# Patient Record
Sex: Male | Born: 1985 | Race: White | Hispanic: No | Marital: Married | Smoking: Current every day smoker
Health system: Southern US, Community
[De-identification: ages and names within clinical notes are randomized; demographics above are authoritative.]

---

## 2010-05-09 ENCOUNTER — Emergency Department (HOSPITAL_COMMUNITY)
Admission: EM | Admit: 2010-05-09 | Discharge: 2010-05-10 | Payer: Self-pay | Source: Home / Self Care | Admitting: Emergency Medicine

## 2010-05-10 ENCOUNTER — Emergency Department (HOSPITAL_COMMUNITY)
Admission: EM | Admit: 2010-05-10 | Discharge: 2010-05-11 | Payer: Self-pay | Source: Home / Self Care | Admitting: Emergency Medicine

## 2010-08-20 LAB — URINE MICROSCOPIC-ADD ON

## 2010-08-20 LAB — URINALYSIS, ROUTINE W REFLEX MICROSCOPIC
Glucose, UA: NEGATIVE mg/dL
Ketones, ur: NEGATIVE mg/dL
Leukocytes, UA: NEGATIVE
Nitrite: NEGATIVE
Specific Gravity, Urine: 1.03 (ref 1.005–1.030)
pH: 5.5 (ref 5.0–8.0)

## 2010-08-20 LAB — BASIC METABOLIC PANEL
BUN: 9 mg/dL (ref 6–23)
CO2: 26 mEq/L (ref 19–32)
Calcium: 9.1 mg/dL (ref 8.4–10.5)
GFR calc non Af Amer: >60 mL/min (ref >60)
Glucose, Bld: 104 mg/dL — ABNORMAL HIGH (ref 70–99)
Sodium: 141 mEq/L (ref 135–145)

## 2010-08-20 LAB — DIFFERENTIAL
Basophils Absolute: 0 10*3/uL (ref 0.0–0.1)
Basophils Relative: 0 % (ref 0–1)
Eosinophils Absolute: 0.6 10*3/uL (ref 0.0–0.7)
Monocytes Absolute: 0.9 10*3/uL (ref 0.1–1.0)
Monocytes Relative: 8 % (ref 3–12)
Neutrophils Relative %: 42 % — ABNORMAL LOW (ref 43–77)

## 2010-08-20 LAB — CBC
Hemoglobin: 14.6 g/dL (ref 13.0–17.0)
MCH: 33.2 pg (ref 26.0–34.0)
MCHC: 36 g/dL (ref 30.0–36.0)
RDW: 12.1 % (ref 11.5–15.5)

## 2010-12-30 ENCOUNTER — Ambulatory Visit: Payer: Self-pay | Admitting: Medical

## 2011-01-13 ENCOUNTER — Encounter: Payer: Self-pay | Admitting: Medical

## 2011-09-15 IMAGING — CT CT ABD-PELV W/O CM
2 of 4 series · 17 of 46 positions shown, 19 images · non-contrast
Comparison: None.

CLINICAL DATA: Intermittent right flank pain.  Evaluate for stones.

CT ABDOMEN AND PELVIS WITHOUT CONTRAST
TECHNIQUE: Multidetector CT imaging of the abdomen and pelvis was
performed following the standard protocol without intravenous
contrast.

[Series 2: stone under 200# w/ prev · axial · 0.74mm/px · z∈[-702,-322]mm · 14 of 84 slices shown, 16 images]
[im 4/84  soft-tissue]
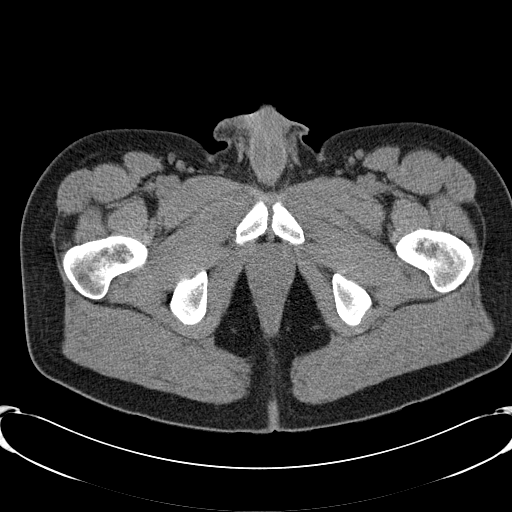
[im 4/84  bone]
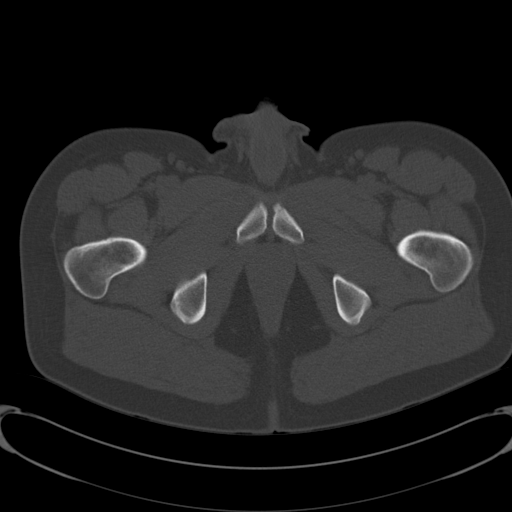
[im 10/84  soft-tissue]
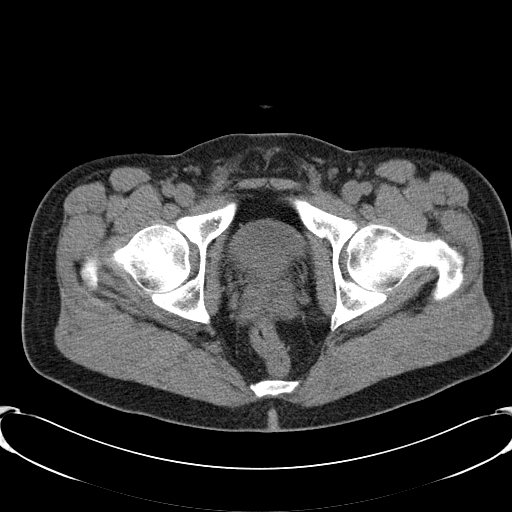
[im 16/84  soft-tissue]
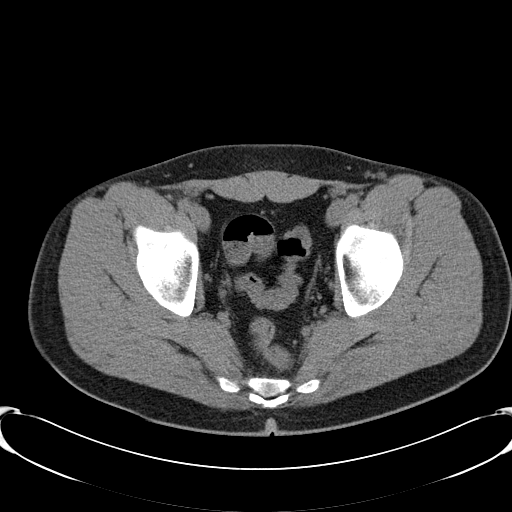
[im 23/84  soft-tissue]
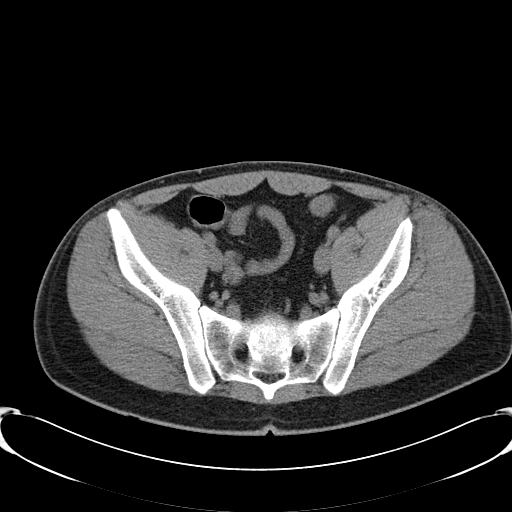
[im 29/84  soft-tissue]
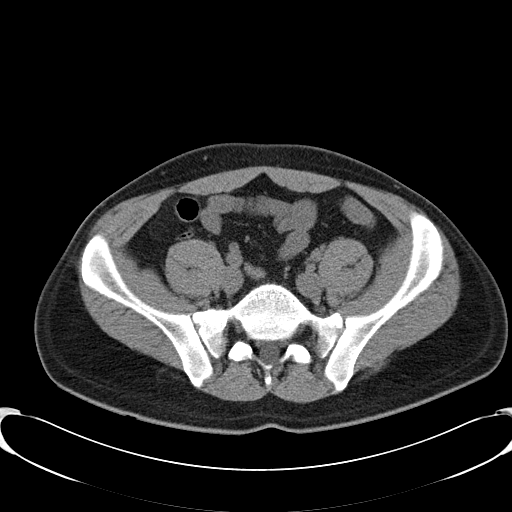
[im 32/84  soft-tissue]
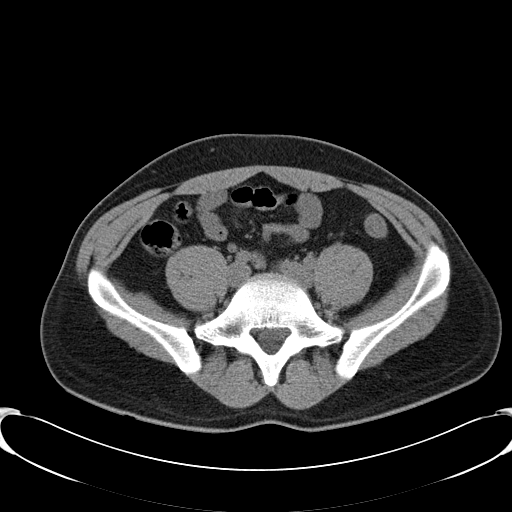
[im 39/84  soft-tissue]
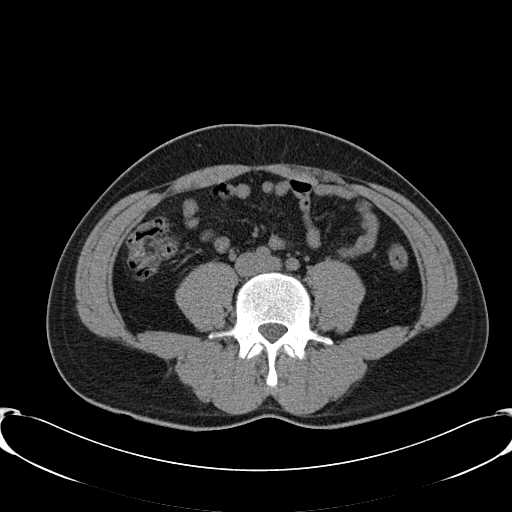
[im 45/84  soft-tissue]
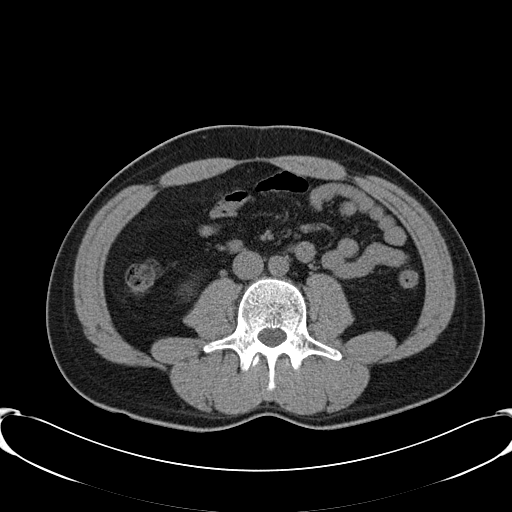
[im 52/84  soft-tissue]
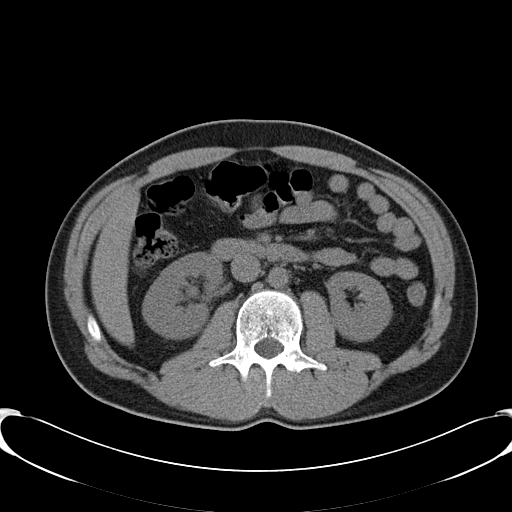
[im 52/84  bone]
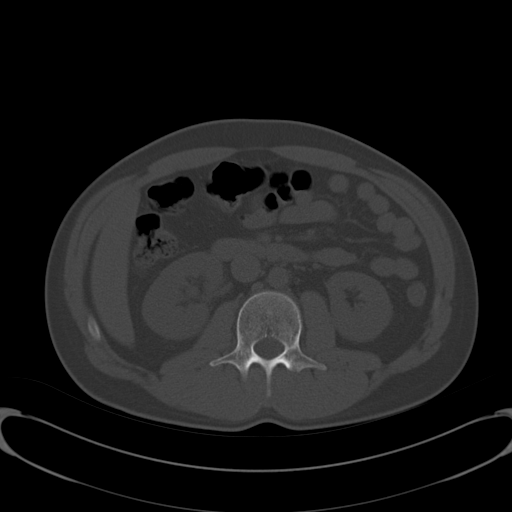
[im 55/84  soft-tissue]
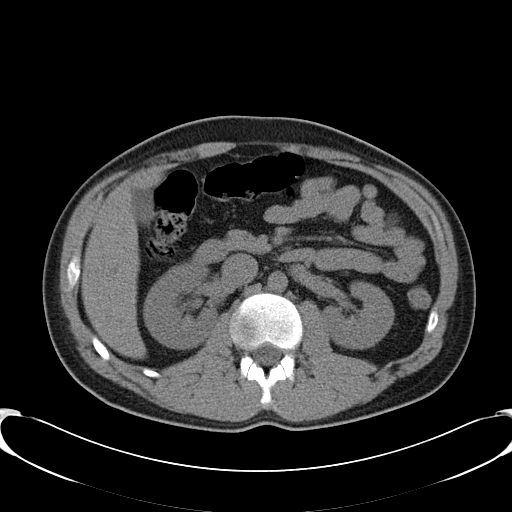
[im 61/84  soft-tissue]
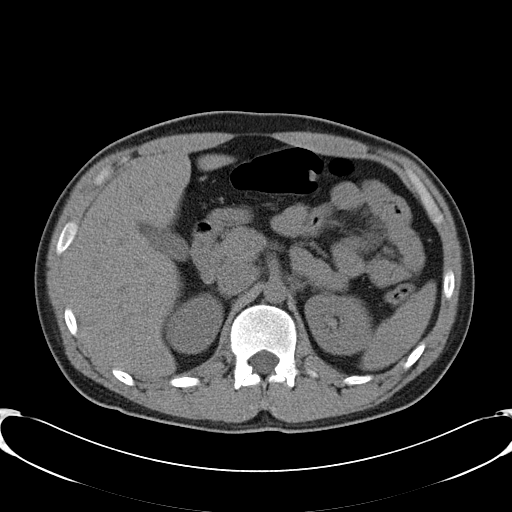
[im 68/84  soft-tissue]
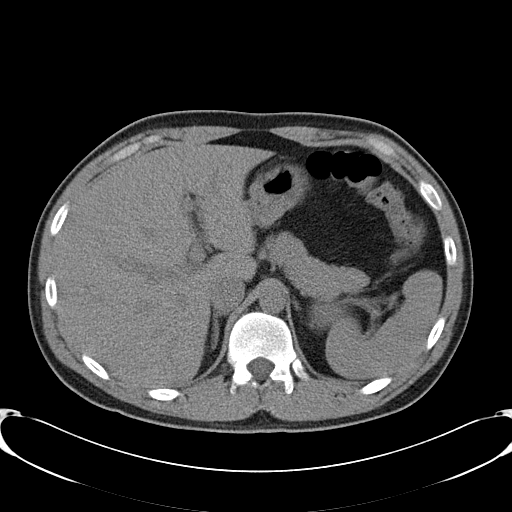
[im 74/84  soft-tissue]
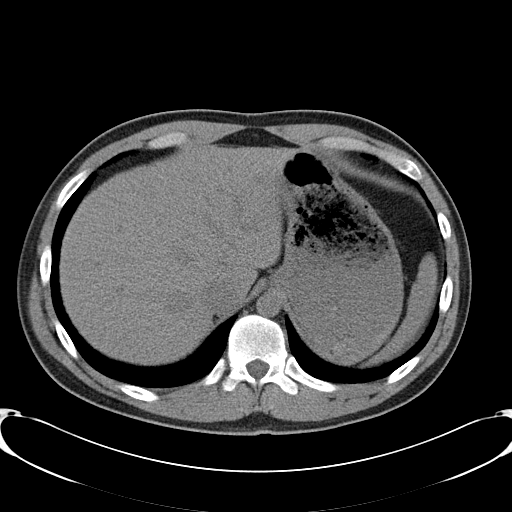
[im 80/84  soft-tissue]
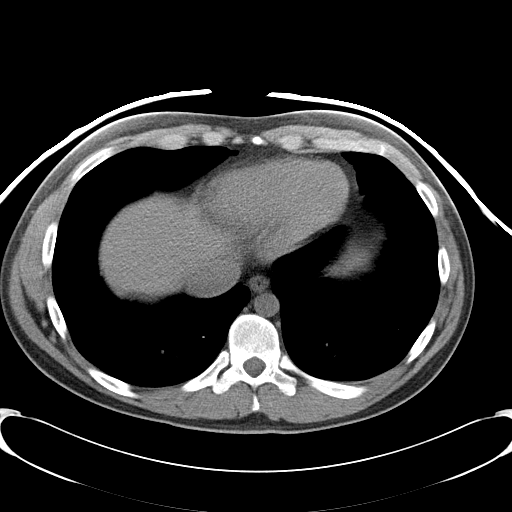

[Series 602: <mpr thick range> · coronal · 0.85mm/px · 3 of 67 slices shown]
[im 23/67  soft-tissue]
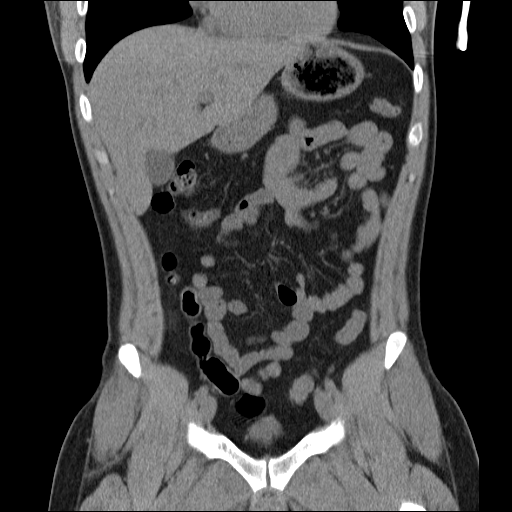
[im 30/67  soft-tissue]
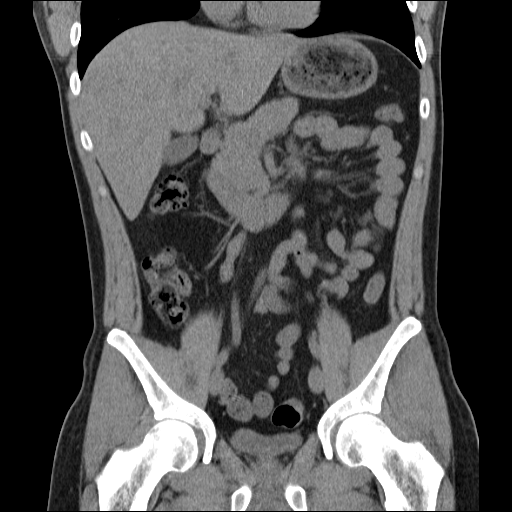
[im 37/67  soft-tissue]
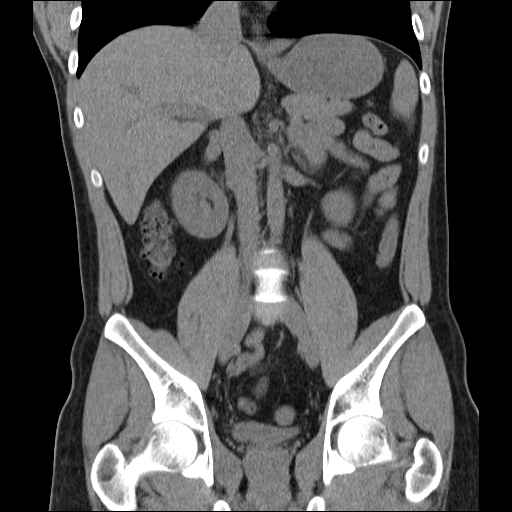

[17 of 46 positions shown; findings below may reference images not displayed]

FINDINGS: The lung bases are clear.  The heart is normal in size.
The unenhanced appearance of the liver, spleen, adrenal glands and
pancreas is within normal limits.  The gallbladder is unremarkable.
No renal calculi are seen.  There is mild prominence of the right
ureter along its course leading to a 3 mm obstructing right UVJ
stone.  No left ureteral stones are seen.  The urinary bladder is
within normal limits.  The stomach is unremarkable.  No small bowel
obstruction is seen.  Normal appendix is identified in the right
lower quadrant.  The colon is within normal limits.  No free fluid
is seen in the abdomen or pelvis.  The soft tissues are
unremarkable.  No aggressive lytic or blastic bone lesions are
seen.
IMPRESSION: Right UVJ ureteral stone with associated mild hydronephrosis.  No
perinephric stranding is seen.

## 2017-09-26 ENCOUNTER — Other Ambulatory Visit: Payer: Self-pay

## 2017-09-26 ENCOUNTER — Ambulatory Visit (HOSPITAL_COMMUNITY)
Admission: EM | Admit: 2017-09-26 | Discharge: 2017-09-26 | Disposition: A | Discharge status: Home / Self Care | Payer: Self-pay | Attending: Family Medicine | Admitting: Family Medicine

## 2017-09-26 ENCOUNTER — Encounter (HOSPITAL_COMMUNITY): Payer: Self-pay | Admitting: *Deleted

## 2017-09-26 DIAGNOSIS — B356 Tinea cruris: Secondary | ICD-10-CM

## 2017-09-26 DIAGNOSIS — L989 Disorder of the skin and subcutaneous tissue, unspecified: Secondary | ICD-10-CM

## 2017-09-26 MED ORDER — KETOCONAZOLE 2 % EX CREA: 1.0000 "application " | TOPICAL_CREAM | Freq: Every day | CUTANEOUS | 0 refills | Status: AC

## 2017-09-26 NOTE — Discharge Instructions (Addendum)
Use cream once daily for 14 days.  Consider hydrocortisone cream 1% twice daily for 10 days to see if that is helpful over area on penis.

## 2017-09-26 NOTE — ED Triage Notes (Signed)
C/O penile lesion x 1 wk.  Now also has lesion on tip of tongue.  Denies penile discharge or any known exposures.

## 2017-09-26 NOTE — ED Provider Notes (Signed)
  [facility]    CSN: [id number] Arrival date & time: 09/26/17  1654  Chief Complaint  Patient presents with  . Exposure to STD    Cody MuttonJeffrey Wiggins is a 32 y.o. male here for a skin complaint.  Duration: 1 week Location: penis Pruritic? No Painful? No Drainage? No New soaps/lotions/topicals/detergents? No Contacts? No Other associated symptoms: Denies d/c, urinary complaints, blistering Therapies tried thus far: cortisone cream w/o relief.  ROS:  Const: No fevers Skin: As noted in HPI  History reviewed. No pertinent past medical history. No Known Allergies Allergies as of 09/26/2017   No Known Allergies     Medication List    TAKE these medications   ketoconazole 2 % cream Commonly known as:  NIZORAL Apply 1 application topically daily for 14 days.       BP 128/74   Pulse 70   Temp 98.1 F (36.7 C)   Resp 16   SpO2 100%  Gen: awake, alert, appearing stated age Lungs: No accessory muscle use Skin: Over the glans near the urethra, there is a small erythematous patch measuring approximately 0.6 cm in diameter.  Perhaps mild scaling.  There is also an erythematous patch in the right inguinal fold.  Central clearing is also noted.  No drainage, TTP, fluctuance, excoriation Psych: Age appropriate judgment and insight  Skin lesion  Jock itch  Okay to try antifungal on penis if topical hydrocortisone does not work.  Ketoconazole called in for jock itch.  It does not look like an ulcer.  There are no vesicles.  Could be balanitis? He is strongly encouraged to follow-up with a PCP. F/u prn. The patient voiced understanding and agreement to the plan.    Sharlene DoryWendling, Nicholas Paul, [place] 09/26/17 2206

## 2020-12-27 ENCOUNTER — Ambulatory Visit (HOSPITAL_COMMUNITY)
Admission: EM | Admit: 2020-12-27 | Discharge: 2020-12-27 | Disposition: A | Discharge status: Home / Self Care | Payer: Medicaid Other | Attending: Family Medicine | Admitting: Family Medicine

## 2020-12-27 ENCOUNTER — Other Ambulatory Visit: Payer: Self-pay

## 2020-12-27 ENCOUNTER — Encounter (HOSPITAL_COMMUNITY): Payer: Self-pay

## 2020-12-27 DIAGNOSIS — K0889 Other specified disorders of teeth and supporting structures: Secondary | ICD-10-CM

## 2020-12-27 MED ORDER — KETOROLAC TROMETHAMINE 30 MG/ML IJ SOLN
INTRAMUSCULAR | Status: AC
  Filled 2020-12-27: qty 1

## 2020-12-27 MED ORDER — AMOXICILLIN-POT CLAVULANATE 875-125 MG PO TABS: 1.0000 | ORAL_TABLET | Freq: Two times a day (BID) | ORAL | 0 refills | Status: DC

## 2020-12-27 MED ORDER — KETOROLAC TROMETHAMINE 30 MG/ML IJ SOLN: 30.0000 mg | Freq: Once | INTRAMUSCULAR | Status: DC

## 2020-12-27 MED ORDER — TRAMADOL HCL 50 MG PO TABS: 50.0000 mg | ORAL_TABLET | Freq: Every evening | ORAL | 0 refills | Status: DC | PRN

## 2020-12-27 NOTE — ED Triage Notes (Signed)
Pt in with c/o broken tooth on right lower side of mouth that occurred 2 days ago  Pt has been taking tylenol with no relief

## 2020-12-27 NOTE — ED Provider Notes (Signed)
[facility]    CSN: [id number] Arrival date & time: 12/27/20  1151      History   Chief Complaint Chief Complaint  Patient presents with   broken tooth    HPI Cody Wiggins is a 35 y.o. male.   Patient presenting today with severe and worsening right lower dental pain from a broken tooth about 3 days ago.  States she was unable to get an appointment with dentist until Monday.  Has been trying ibuprofen and Tylenol with minimal relief.  Has viscous lidocaine at home for the past broken tooth that is not helping either.  Denies fever, chills, facial swelling, dysphagia, headache.   History reviewed. No pertinent past medical history.  There are no problems to display for this patient.   History reviewed. No pertinent surgical history.     Home Medications    Prior to Admission medications   Medication Sig Start Date End Date Taking? Authorizing Provider  amoxicillin-clavulanate (AUGMENTIN) 875-125 MG tablet Take 1 tablet by mouth every 12 (twelve) hours. 12/27/20  Yes Particia Nearing, PA-C  traMADol (ULTRAM) 50 MG tablet Take 1 tablet (50 mg total) by mouth at bedtime as needed. 12/27/20  Yes Particia Nearing, PA-C    Family History Family History  Problem Relation Age of Onset   Diabetes Mother     Social History Social History   Tobacco Use   Smoking status: Every Day   Smokeless tobacco: Never  Substance Use Topics   Alcohol use: Yes    Comment: occasionally   Drug use: Yes    Types: Marijuana     Allergies   Patient has no known allergies.   Review of Systems Review of Systems Per HPI  Physical Exam Triage Vital Signs ED Triage Vitals [12/27/20 1208]  Enc Vitals Group     BP 131/80     Pulse Rate 66     Resp 19     Temp 98 F (36.7 C)     Temp Source Oral     SpO2 100 %     Weight      Height      Head Circumference      Peak Flow      Pain Score 8     Pain Loc      Pain Edu?      Excl. in GC?    No data  found.  Updated Vital Signs BP 131/80 (BP Location: Right Arm)   Pulse 66   Temp 98 F (36.7 C) (Oral)   Resp 19   SpO2 100%   Visual Acuity Right Eye Distance:   Left Eye Distance:   Bilateral Distance:    Right Eye Near:   Left Eye Near:    Bilateral Near:     Physical Exam Vitals and nursing note reviewed.  Constitutional:      Appearance: Normal appearance.  HENT:     Head: Atraumatic.     Mouth/Throat:     Mouth: Mucous membranes are moist.     Pharynx: No oropharyngeal exudate.     Comments: Severe decay and gingival erythema right lower molars. Eyes:     Extraocular Movements: Extraocular movements intact.     Conjunctiva/sclera: Conjunctivae normal.  Cardiovascular:     Rate and Rhythm: Normal rate and regular rhythm.  Pulmonary:     Effort: Pulmonary effort is normal.     Breath sounds: Normal breath sounds.  Musculoskeletal:  General: Normal range of motion.     Cervical back: Normal range of motion and neck supple.  Skin:    General: Skin is warm and dry.  Neurological:     General: No focal deficit present.     Mental Status: He is oriented to person, place, and time.  Psychiatric:        Mood and Affect: Mood normal.        Thought Content: Thought content normal.        Judgment: Judgment normal.     UC Treatments / Results  Labs (all labs ordered are listed, but only abnormal results are displayed) Labs Reviewed - No data to display  EKG   Radiology No results found.  Procedures Procedures (including critical care time)  Medications Ordered in UC Medications  ketorolac (TORADOL) 30 MG/ML injection 30 mg (30 mg Intramuscular Patient Refused/Not Given 12/27/20 1255)    Initial Impression / Assessment and Plan / UC Course  I have reviewed the triage vital signs and the nursing notes.  Pertinent labs & imaging results that were available during my care of the patient were reviewed by me and considered in my medical decision  making (see chart for details).     Offered him Toradol for further pain control, patient initially was agreeable to this but when nursing staff to administer the injection he declined it.  Continue the viscous lidocaine, Augmentin sent to cover for infection until he can get in with dentist, very small supply of tramadol given for bedtime pain control.  Strict precautions reviewed with his medication and PDMP reviewed and appropriate.  Final Clinical Impressions(s) / UC Diagnoses   Final diagnoses:  Pain, dental   Discharge Instructions   None    ED Prescriptions     Medication Sig Dispense Auth. Provider   traMADol (ULTRAM) 50 MG tablet Take 1 tablet (50 mg total) by mouth at bedtime as needed. 5 tablet Particia Nearing, PA-C   amoxicillin-clavulanate (AUGMENTIN) 875-125 MG tablet Take 1 tablet by mouth every 12 (twelve) hours. 14 tablet Particia Nearing, [place]      I have reviewed the PDMP during this encounter.   Particia Nearing, [place] 12/27/20 1306

## 2023-11-14 ENCOUNTER — Encounter (HOSPITAL_COMMUNITY): Payer: Self-pay | Admitting: Emergency Medicine

## 2023-11-14 ENCOUNTER — Emergency Department (HOSPITAL_COMMUNITY)

## 2023-11-14 ENCOUNTER — Inpatient Hospital Stay (HOSPITAL_COMMUNITY)

## 2023-11-14 ENCOUNTER — Other Ambulatory Visit: Payer: Self-pay

## 2023-11-14 ENCOUNTER — Observation Stay (HOSPITAL_COMMUNITY)
Admission: EM | Admit: 2023-11-14 | Discharge: 2023-11-14 | Disposition: A | Discharge status: Home / Self Care | Attending: Surgery | Admitting: Surgery

## 2023-11-14 DIAGNOSIS — Z743 Need for continuous supervision: Secondary | ICD-10-CM | POA: Diagnosis not present

## 2023-11-14 DIAGNOSIS — F12920 Cannabis use, unspecified with intoxication, uncomplicated: Secondary | ICD-10-CM | POA: Insufficient documentation

## 2023-11-14 DIAGNOSIS — S32012A Unstable burst fracture of first lumbar vertebra, initial encounter for closed fracture: Principal | ICD-10-CM | POA: Insufficient documentation

## 2023-11-14 DIAGNOSIS — I609 Nontraumatic subarachnoid hemorrhage, unspecified: Secondary | ICD-10-CM | POA: Diagnosis present

## 2023-11-14 DIAGNOSIS — S066X9A Traumatic subarachnoid hemorrhage with loss of consciousness of unspecified duration, initial encounter: Secondary | ICD-10-CM | POA: Diagnosis not present

## 2023-11-14 DIAGNOSIS — F10920 Alcohol use, unspecified with intoxication, uncomplicated: Secondary | ICD-10-CM | POA: Diagnosis not present

## 2023-11-14 DIAGNOSIS — S32049A Unspecified fracture of fourth lumbar vertebra, initial encounter for closed fracture: Secondary | ICD-10-CM | POA: Insufficient documentation

## 2023-11-14 DIAGNOSIS — S0083XA Contusion of other part of head, initial encounter: Secondary | ICD-10-CM | POA: Diagnosis present

## 2023-11-14 LAB — I-STAT CHEM 8, ED
BUN: 5 mg/dL — ABNORMAL LOW (ref 6–20)
Calcium, Ion: 1.13 mmol/L — ABNORMAL LOW (ref 1.15–1.40)
Chloride: 105 mmol/L (ref 98–111)
Creatinine, Ser: 1.2 mg/dL (ref 0.61–1.24)
Glucose, Bld: 94 mg/dL (ref 70–99)
HCT: 48 % (ref 39.0–52.0)
Hemoglobin: 16.3 g/dL (ref 13.0–17.0)
Potassium: 3.6 mmol/L (ref 3.5–5.1)
Sodium: 141 mmol/L (ref 135–145)
TCO2: 21 mmol/L — ABNORMAL LOW (ref 22–32)

## 2023-11-14 LAB — COMPREHENSIVE METABOLIC PANEL WITH GFR
ALT: 69 U/L — ABNORMAL HIGH (ref 0–44)
AST: 56 U/L — ABNORMAL HIGH (ref 15–41)
Albumin: 4.1 g/dL (ref 3.5–5.0)
Alkaline Phosphatase: 86 U/L (ref 38–126)
Anion gap: 8 (ref 5–15)
BUN: 7 mg/dL (ref 6–20)
CO2: 22 mmol/L (ref 22–32)
Calcium: 8.7 mg/dL — ABNORMAL LOW (ref 8.9–10.3)
Chloride: 106 mmol/L (ref 98–111)
Creatinine, Ser: 0.92 mg/dL (ref 0.61–1.24)
GFR, Estimated: >60 mL/min (ref >60)
Glucose, Bld: 97 mg/dL (ref 70–99)
Potassium: 3.4 mmol/L — ABNORMAL LOW (ref 3.5–5.1)
Sodium: 136 mmol/L (ref 135–145)
Total Bilirubin: 0.5 mg/dL (ref 0.0–1.2)
Total Protein: 7.3 g/dL (ref 6.5–8.1)

## 2023-11-14 LAB — MRSA NEXT GEN BY PCR, NASAL: MRSA by PCR Next Gen: DETECTED — AB

## 2023-11-14 LAB — SAMPLE TO BLOOD BANK

## 2023-11-14 LAB — CBC
HCT: 45.8 % (ref 39.0–52.0)
Hemoglobin: 15.8 g/dL (ref 13.0–17.0)
MCH: 34.6 pg — ABNORMAL HIGH (ref 26.0–34.0)
MCHC: 34.5 g/dL (ref 30.0–36.0)
MCV: 100.2 fL — ABNORMAL HIGH (ref 80.0–100.0)
Platelets: 318 10*3/uL (ref 150–400)
RBC: 4.57 MIL/uL (ref 4.22–5.81)
RDW: 12.7 % (ref 11.5–15.5)
WBC: 14.1 10*3/uL — ABNORMAL HIGH (ref 4.0–10.5)
nRBC: 0 % (ref 0.0–0.2)

## 2023-11-14 LAB — PROTIME-INR
INR: 1 (ref 0.8–1.2)
Prothrombin Time: 12.9 s (ref 11.4–15.2)

## 2023-11-14 LAB — ETHANOL: Alcohol, Ethyl (B): 122 mg/dL — ABNORMAL HIGH (ref <15)

## 2023-11-14 MED ORDER — IOHEXOL 350 MG/ML SOLN
100.0000 mL | Freq: Once | INTRAVENOUS | Status: AC | PRN
  Administered 2023-11-14: 100 mL via INTRAVENOUS

## 2023-11-14 MED ORDER — OXYCODONE-ACETAMINOPHEN 5-325 MG PO TABS
2.0000 | ORAL_TABLET | Freq: Once | ORAL | Status: AC
  Administered 2023-11-14: 2 via ORAL
  Filled 2023-11-14: qty 2

## 2023-11-14 MED ORDER — ONDANSETRON HCL 4 MG/2ML IJ SOLN
4.0000 mg | Freq: Four times a day (QID) | INTRAMUSCULAR | Status: DC | PRN
  Administered 2023-11-14 (×2): 4 mg via INTRAVENOUS
  Filled 2023-11-14: qty 2

## 2023-11-14 MED ORDER — METHOCARBAMOL 500 MG PO TABS
500.0000 mg | ORAL_TABLET | Freq: Three times a day (TID) | ORAL | Status: DC
  Administered 2023-11-14 (×2): 500 mg via ORAL
  Filled 2023-11-14 (×2): qty 1

## 2023-11-14 MED ORDER — GABAPENTIN 300 MG PO CAPS
300.0000 mg | ORAL_CAPSULE | Freq: Three times a day (TID) | ORAL | Status: DC
  Administered 2023-11-14 (×2): 300 mg via ORAL
  Filled 2023-11-14 (×2): qty 1

## 2023-11-14 MED ORDER — POLYETHYLENE GLYCOL 3350 17 G PO PACK: 17.0000 g | PACK | Freq: Every day | ORAL | Status: DC | PRN

## 2023-11-14 MED ORDER — OXYCODONE HCL 5 MG PO TABS: 5.0000 mg | ORAL_TABLET | Freq: Four times a day (QID) | ORAL | 0 refills | Status: DC | PRN

## 2023-11-14 MED ORDER — METHOCARBAMOL 500 MG PO TABS
500.0000 mg | ORAL_TABLET | Freq: Once | ORAL | Status: AC
  Administered 2023-11-14: 500 mg via ORAL
  Filled 2023-11-14: qty 1

## 2023-11-14 MED ORDER — DOCUSATE SODIUM 100 MG PO CAPS
100.0000 mg | ORAL_CAPSULE | Freq: Two times a day (BID) | ORAL | Status: DC
  Administered 2023-11-14: 100 mg via ORAL
  Filled 2023-11-14: qty 1

## 2023-11-14 MED ORDER — HYDROMORPHONE HCL 1 MG/ML IJ SOLN
0.5000 mg | INTRAMUSCULAR | Status: DC | PRN
  Administered 2023-11-14 (×2): 0.5 mg via INTRAVENOUS
  Filled 2023-11-14 (×2): qty 1

## 2023-11-14 MED ORDER — ACETAMINOPHEN 500 MG PO TABS
1000.0000 mg | ORAL_TABLET | Freq: Once | ORAL | Status: AC
  Administered 2023-11-14: 1000 mg via ORAL
  Filled 2023-11-14: qty 2

## 2023-11-14 MED ORDER — MELATONIN 3 MG PO TABS: 3.0000 mg | ORAL_TABLET | Freq: Every evening | ORAL | Status: DC | PRN

## 2023-11-14 MED ORDER — ACETAMINOPHEN 500 MG PO TABS: 500.0000 mg | ORAL_TABLET | Freq: Four times a day (QID) | ORAL | 0 refills | Status: AC | PRN

## 2023-11-14 MED ORDER — MUPIROCIN 2 % EX OINT
1.0000 | TOPICAL_OINTMENT | Freq: Two times a day (BID) | CUTANEOUS | Status: DC
Start: 2023-11-14 — End: 2023-11-14
  Administered 2023-11-14: 1 via NASAL
  Filled 2023-11-14: qty 22

## 2023-11-14 MED ORDER — DOCUSATE SODIUM 100 MG PO CAPS: 100.0000 mg | ORAL_CAPSULE | Freq: Two times a day (BID) | ORAL | 0 refills | Status: AC

## 2023-11-14 MED ORDER — CHLORHEXIDINE GLUCONATE CLOTH 2 % EX PADS
6.0000 | MEDICATED_PAD | Freq: Every day | CUTANEOUS | Status: DC
Start: 2023-11-14 — End: 2023-11-14
  Administered 2023-11-14: 6 via TOPICAL

## 2023-11-14 MED ORDER — HYDROMORPHONE HCL 1 MG/ML IJ SOLN
1.0000 mg | Freq: Once | INTRAMUSCULAR | Status: AC
  Administered 2023-11-14: 1 mg via INTRAVENOUS
  Filled 2023-11-14: qty 1

## 2023-11-14 MED ORDER — HYDROMORPHONE HCL 1 MG/ML IJ SOLN
0.5000 mg | Freq: Once | INTRAMUSCULAR | Status: AC
  Administered 2023-11-14: 0.5 mg via INTRAVENOUS
  Filled 2023-11-14: qty 0.5

## 2023-11-14 MED ORDER — ACETAMINOPHEN 500 MG PO TABS
1000.0000 mg | ORAL_TABLET | Freq: Four times a day (QID) | ORAL | Status: DC
  Administered 2023-11-14 (×2): 1000 mg via ORAL
  Filled 2023-11-14 (×2): qty 2

## 2023-11-14 MED ORDER — METOPROLOL TARTRATE 5 MG/5ML IV SOLN: 5.0000 mg | Freq: Four times a day (QID) | INTRAVENOUS | Status: DC | PRN

## 2023-11-14 MED ORDER — GABAPENTIN 300 MG PO CAPS: 300.0000 mg | ORAL_CAPSULE | Freq: Three times a day (TID) | ORAL | 0 refills | Status: AC

## 2023-11-14 MED ORDER — HYDROMORPHONE HCL 1 MG/ML IJ SOLN
1.0000 mg | Freq: Once | INTRAMUSCULAR | Status: DC
  Filled 2023-11-14: qty 1

## 2023-11-14 MED ORDER — OXYCODONE HCL 5 MG PO TABS
5.0000 mg | ORAL_TABLET | ORAL | Status: DC | PRN
  Administered 2023-11-14: 5 mg via ORAL
  Filled 2023-11-14: qty 1

## 2023-11-14 MED ORDER — METHOCARBAMOL 1000 MG/10ML IJ SOLN: 500.0000 mg | Freq: Three times a day (TID) | INTRAMUSCULAR | Status: DC

## 2023-11-14 MED ORDER — HYDRALAZINE HCL 20 MG/ML IJ SOLN: 10.0000 mg | INTRAMUSCULAR | Status: DC | PRN

## 2023-11-14 MED ORDER — LEVETIRACETAM 500 MG PO TABS: 500.0000 mg | ORAL_TABLET | Freq: Two times a day (BID) | ORAL | 0 refills | Status: AC

## 2023-11-14 MED ORDER — POLYETHYLENE GLYCOL 3350 17 G PO PACK: 17.0000 g | PACK | Freq: Every day | ORAL | Status: AC | PRN

## 2023-11-14 MED ORDER — ONDANSETRON HCL 4 MG/2ML IJ SOLN
4.0000 mg | Freq: Once | INTRAMUSCULAR | Status: AC
  Filled 2023-11-14: qty 2

## 2023-11-14 MED ORDER — SODIUM CHLORIDE 0.9 % IV SOLN: INTRAVENOUS | Status: DC

## 2023-11-14 MED ORDER — METHOCARBAMOL 500 MG PO TABS: 500.0000 mg | ORAL_TABLET | Freq: Four times a day (QID) | ORAL | 0 refills | Status: AC | PRN

## 2023-11-14 MED ORDER — TETANUS-DIPHTH-ACELL PERTUSSIS 5-2.5-18.5 LF-MCG/0.5 IM SUSY
0.5000 mL | PREFILLED_SYRINGE | Freq: Once | INTRAMUSCULAR | Status: AC
  Administered 2023-11-14: 0.5 mL via INTRAMUSCULAR
  Filled 2023-11-14: qty 0.5

## 2023-11-14 MED ORDER — OXYCODONE HCL 5 MG PO TABS
10.0000 mg | ORAL_TABLET | ORAL | Status: DC | PRN
  Administered 2023-11-14 (×2): 10 mg via ORAL
  Filled 2023-11-14 (×2): qty 2

## 2023-11-14 MED ORDER — LEVETIRACETAM (KEPPRA) 500 MG/5 ML ADULT IV PUSH
500.0000 mg | Freq: Two times a day (BID) | INTRAVENOUS | Status: DC
  Administered 2023-11-14: 500 mg via INTRAVENOUS
  Filled 2023-11-14: qty 5

## 2023-11-14 MED ORDER — ONDANSETRON 4 MG PO TBDP: 4.0000 mg | ORAL_TABLET | Freq: Four times a day (QID) | ORAL | Status: DC | PRN
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
## 2023-11-14 NOTE — ED Notes (Signed)
 Pt taken to CT.

## 2023-11-14 NOTE — ED Notes (Signed)
 At this time, pt continues to refuse labs or IV. Pt wishes to speak to wife and make a decision
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
## 2023-11-14 NOTE — ED Notes (Signed)
 Pt states he consents to IV pain meds but no bloodwork at this time.
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
## 2023-11-14 NOTE — Progress Notes (Signed)
 Orthopedic Tech Progress Note Patient Details:  Cody Wiggins Aug 13, 1985 [id number]  TLSO applied to pt. Pt and family at bedside express understanding on how to apply/remove/adjust the brace.  Ortho Devices Type of Ortho Device: Thoracolumbar corset (TLSO) Ortho Device/Splint Location: back Ortho Device/Splint Interventions: Ordered, Application, Adjustment   Post Interventions Patient Tolerated: Fair Instructions Provided: Care of device, Adjustment of device  Cody Wiggins Cody Wiggins 11/14/2023, 11:39 AM
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
## 2023-11-14 NOTE — ED Notes (Signed)
 Dr. Luberta Ruse speaking to pt again at this time
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
## 2023-11-14 NOTE — ED Notes (Addendum)
 Patient given water to take medications.

## 2023-11-14 NOTE — ED Notes (Signed)
 Spoke with  Twila Gale, about TLSO Brace, Informed that brace will be applied when Patient arrives at [facility].
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
## 2023-11-14 NOTE — Progress Notes (Signed)
 Patient c/o of pain in right hand and left upper arm.  Xrays ordered, patient refused xrays.  Patient also satting between 85-91%, refusing oxygen.  Requesting IV pain meds to help with his oxygen saturations. RN informed patient he can only have IV pain meds if he is compliant with oxygen requirements.

## 2023-11-14 NOTE — ED Notes (Signed)
 [facility] got patient to stand and transfer to stretcher. Requested MD for Zofran per patient request. Obtained order, [facility] had took patient to truck to transport.
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
## 2023-11-14 NOTE — Discharge Instructions (Addendum)
 Activity You may remove your brace for sleeping but must otherwise wear it at ALL times, even when showering.  Medications A  prescription for pain medication may be given to you upon discharge.  Take your pain medication as prescribed, if needed.  If narcotic pain medicine is not needed, then you may take acetaminophen (Tylenol) or ibuprofen (Advil) as needed. It is common to experience some constipation if taking pain medication after surgery.  Increasing fluid intake and taking a stool softener (such as Colace) will usually help or prevent this problem from occurring.  A mild laxative (Milk of Magnesia or Miralax) should be taken according to package directions if there are no bowel movements after 48 hours. Take your usually prescribed medications unless otherwise directed. If you need a refill on your pain medication, please contact your pharmacy.  They will contact our office to request authorization. Prescriptions will not be filled after 5 pm or on weekends.  When to Call Us : Worsening pain Numbness or tingling in the extremities Changes in mental status  Follow-up  The clinic staff is available to answer your questions during regular business hours.  Please don't hesitate to call and ask to speak to one of the nurses for clinical concerns.  If you have a medical emergency, go to the nearest emergency room or call 911.  A surgeon from [facility] is always on call at the hospital  [address], [place], [place]  [address] ?  [address] [address], [place], [place]   [address] [phone] ? Toll Free: [phone] ? FAX [phone] Web site: [email or web address]      Managing Your Pain After Surgery Without Opioids    Thank you for participating in our program to help patients manage their pain after surgery without opioids. This is part of our effort to provide you with the best care possible, without exposing you or your family to the risk  that opioids pose.  What pain can I expect after surgery? You can expect to have some pain after surgery. This is normal. The pain is typically worse the day after surgery, and quickly begins to get better. Many studies have found that many patients are able to manage their pain after surgery with Over-the-Counter (OTC) medications such as Tylenol and Motrin. If you have a condition that does not allow you to take Tylenol or Motrin, notify your surgical team.  How will I manage my pain? The best strategy for controlling your pain after surgery is around the clock pain control with Tylenol (acetaminophen) and Motrin (ibuprofen or Advil). Alternating these medications with each other allows you to maximize your pain control. In addition to Tylenol and Motrin, you can use heating pads or ice packs on your incisions to help reduce your pain.  How will I alternate your regular strength over-the-counter pain medication? You will take a dose of pain medication every three hours. Start by taking 650 mg of Tylenol (2 pills of 325 mg) 3 hours later take 600 mg of Motrin (3 pills of 200 mg) 3 hours after taking the Motrin take 650 mg of Tylenol 3 hours after that take 600 mg of Motrin.   - 1 -  See example - if your first dose of Tylenol is at 12:00 PM   12:00 PM Tylenol 650 mg (2 pills of 325 mg)  3:00 PM Motrin 600 mg (3 pills of 200 mg)  6:00 PM Tylenol 650 mg (2 pills of 325 mg)  9:00 PM Motrin 600 mg (3 pills of 200 mg)  Continue alternating every 3 hours   We recommend that you follow this schedule around-the-clock for at least 3 days after surgery, or until you feel that it is no longer needed. Use the table on the last page of this handout to keep track of the medications you are taking. Important: Do not take more than 3000mg  of Tylenol or 3200mg  of Motrin in a 24-hour period. Do not take ibuprofen/Motrin if you have a history of bleeding stomach ulcers, severe kidney disease, &/or  actively taking a blood thinner  What if I still have pain? If you have pain that is not controlled with the over-the-counter pain medications (Tylenol and Motrin or Advil) you might have what we call "breakthrough" pain. You will receive a prescription for a small amount of an opioid pain medication such as Oxycodone, Tramadol , or Tylenol with Codeine. Use these opioid pills in the first 24 hours after surgery if you have breakthrough pain. Do not take more than 1 pill every 4-6 hours.  If you still have uncontrolled pain after using all opioid pills, don't hesitate to call our staff using the number provided. We will help make sure you are managing your pain in the best way possible, and if necessary, we can provide a prescription for additional pain medication.   Day 1    Time  Name of Medication Number of pills taken  Amount of Acetaminophen  Pain Level   Comments  AM PM       AM PM       AM PM       AM PM       AM PM       AM PM       AM PM       AM PM       Total Daily amount of Acetaminophen Do not take more than  3,000 mg per day      Day 2    Time  Name of Medication Number of pills taken  Amount of Acetaminophen  Pain Level   Comments  AM PM       AM PM       AM PM       AM PM       AM PM       AM PM       AM PM       AM PM       Total Daily amount of Acetaminophen Do not take more than  3,000 mg per day      Day 3    Time  Name of Medication Number of pills taken  Amount of Acetaminophen  Pain Level   Comments  AM PM       AM PM       AM PM       AM PM         AM PM       AM PM       AM PM       AM PM       Total Daily amount of Acetaminophen Do not take more than  3,000 mg per day      Day 4    Time  Name of Medication Number of pills taken  Amount of Acetaminophen  Pain Level   Comments  AM PM       AM PM  AM PM       AM PM       AM PM       AM PM       AM PM       AM PM       Total Daily amount of  Acetaminophen Do not take more than  3,000 mg per day      Day 5    Time  Name of Medication Number of pills taken  Amount of Acetaminophen  Pain Level   Comments  AM PM       AM PM       AM PM       AM PM       AM PM       AM PM       AM PM       AM PM       Total Daily amount of Acetaminophen Do not take more than  3,000 mg per day      Day 6    Time  Name of Medication Number of pills taken  Amount of Acetaminophen  Pain Level  Comments  AM PM       AM PM       AM PM       AM PM       AM PM       AM PM       AM PM       AM PM       Total Daily amount of Acetaminophen Do not take more than  3,000 mg per day      Day 7    Time  Name of Medication Number of pills taken  Amount of Acetaminophen  Pain Level   Comments  AM PM       AM PM       AM PM       AM PM       AM PM       AM PM       AM PM       AM PM       Total Daily amount of Acetaminophen Do not take more than  3,000 mg per day        For additional information about how and where to safely dispose of unused opioid medications - [email or web address]  Disclaimer: This document contains information and/or instructional materials adapted from [facility] for the typical patient with your condition. It does not replace medical advice from your health care provider because your experience may differ from that of the typical patient. Talk to your health care provider if you have any questions about this document, your condition or your treatment plan. Adapted from [facility]

## 2023-11-19 ENCOUNTER — Other Ambulatory Visit: Payer: Self-pay

## 2023-11-19 ENCOUNTER — Emergency Department (HOSPITAL_COMMUNITY)
Admission: EM | Admit: 2023-11-19 | Discharge: 2023-11-19 | Disposition: A | Discharge status: Home / Self Care | Attending: Emergency Medicine | Admitting: Emergency Medicine

## 2023-11-19 DIAGNOSIS — Z09 Encounter for follow-up examination after completed treatment for conditions other than malignant neoplasm: Secondary | ICD-10-CM | POA: Insufficient documentation

## 2023-11-19 DIAGNOSIS — M791 Myalgia, unspecified site: Secondary | ICD-10-CM | POA: Insufficient documentation

## 2023-11-19 MED ORDER — ACETAMINOPHEN 500 MG PO TABS
1000.0000 mg | ORAL_TABLET | Freq: Once | ORAL | Status: AC
  Administered 2023-11-19: 1000 mg via ORAL
  Filled 2023-11-19: qty 2

## 2023-11-19 MED ORDER — ONDANSETRON 4 MG PO TBDP
4.0000 mg | ORAL_TABLET | Freq: Once | ORAL | Status: AC
  Administered 2023-11-19: 4 mg via ORAL
  Filled 2023-11-19: qty 1

## 2023-11-19 MED ORDER — OXYCODONE HCL 5 MG PO TABS: 5.0000 mg | ORAL_TABLET | Freq: Four times a day (QID) | ORAL | 0 refills | Status: AC | PRN

## 2023-11-19 MED ORDER — METHOCARBAMOL 500 MG PO TABS
500.0000 mg | ORAL_TABLET | Freq: Once | ORAL | Status: AC
  Administered 2023-11-19: 500 mg via ORAL
  Filled 2023-11-19: qty 1

## 2023-11-19 MED ORDER — FENTANYL CITRATE PF 50 MCG/ML IJ SOSY
50.0000 ug | PREFILLED_SYRINGE | Freq: Once | INTRAMUSCULAR | Status: AC
  Administered 2023-11-19: 50 ug via INTRAMUSCULAR
  Filled 2023-11-19: qty 1
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
## 2023-11-19 NOTE — Discharge Instructions (Signed)
 Follow-up with the neurosurgery clinic.  They should give you a call today to schedule this appointment.  Have also attached information for trauma clinic.  You can follow-up with them.  Return for any emergent symptoms.  You can apply Neosporin to your wounds to the left arm and right wrist.  Currently they do not look infected.
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
## 2024-06-30 NOTE — Therapy (Incomplete)
 " OUTPATIENT PHYSICAL THERAPY THORACOLUMBAR EVALUATION   Patient Name: Cody Wiggins MRN: [id number] DOB:Apr 03, 1986, 39 y.o., male Today's Date: 06/30/2024  END OF SESSION:   No past medical history on file. No past surgical history on file. Patient Active Problem List   Diagnosis Date Noted   SAH (subarachnoid hemorrhage) (HCC) 11/14/2023   Subarachnoid hemorrhage (HCC) 11/14/2023    PCP: ***  REFERRING PROVIDER: Joshua Clayborne RAMAN, PA-C  REFERRING DIAG: [phone] (ICD-10-CM) - Unspecified injury to unspecified level of lumbar spinal cord, sequela  Rationale for Evaluation and Treatment: Rehabilitation  THERAPY DIAG:  No diagnosis found.  ONSET DATE: ***  SUBJECTIVE:                                                                                                                                                                                           SUBJECTIVE STATEMENT: ***  PERTINENT HISTORY:  ***  PAIN:  Are you having pain? {OPRCPAIN:27236}  PRECAUTIONS: {Therapy precautions:24002}  RED FLAGS: {PT Red Flags:29287}   WEIGHT BEARING RESTRICTIONS: {Yes ***/No:24003}  FALLS:  Has patient fallen in last 6 months? {fallsyesno:27318}  LIVING ENVIRONMENT: Lives with: {OPRC lives with:25569::lives with their family} Lives in: {Lives in:25570} Stairs: {opstairs:27293} Has following equipment at home: {Assistive devices:23999}  OCCUPATION: ***  PLOF: {PLOF:24004}  PATIENT GOALS: ***  NEXT MD VISIT: ***  OBJECTIVE:  Note: Objective measures were completed at Evaluation unless otherwise noted.  DIAGNOSTIC FINDINGS:  ***  PATIENT SURVEYS:  Modified Oswestry:  MODIFIED OSWESTRY DISABILITY SCALE  Date: *** Score  Pain intensity {ODI 1:32962}  2. Personal care (washing, dressing, etc.) {ODI 2:32963}  3. Lifting {ODI 3:32964}  4. Walking {ODI 4:32965}  5. Sitting {ODI 5:32966}  6. Standing {ODI 6:32967}  7. Sleeping {ODI 7:32968}  8. Social Life {ODI  8:32969}  9. Traveling {ODI 9:32970}  10. Employment/ Homemaking {ODI 10:32971}  Total ***/50   Interpretation of scores: Score Category Description  0-20% Minimal Disability The patient can cope with most living activities. Usually no treatment is indicated apart from advice on lifting, sitting and exercise  21-40% Moderate Disability The patient experiences more pain and difficulty with sitting, lifting and standing. Travel and social life are more difficult and they may be disabled from work. Personal care, sexual activity and sleeping are not grossly affected, and the patient can usually be managed by conservative means  41-60% Severe Disability Pain remains the main problem in this group, but activities of daily living are affected. These patients require a detailed investigation  61-80% Crippled Back pain impinges on all aspects of the patients life. Positive intervention is required  81-100% Bed-bound These  patients are either bed-bound or exaggerating their symptoms  Bluford FORBES Zoe DELENA Karon DELENA, et al. Surgery versus conservative management of stable thoracolumbar fracture: the PRESTO feasibility RCT. [place] ([place]): [employer]; 2021 Nov. [facility] Technology Assessment, No. 25.62.) Appendix 3, Oswestry Disability Index category descriptors. Available from: [email or web address].cz  Minimally Clinically Important Difference (MCID) = 12.8%  COGNITION: Overall cognitive status: {cognition:24006}     SENSATION: {sensation:27233}  MUSCLE LENGTH: Hamstrings: Right *** deg; Left *** deg Debby test: Right *** deg; Left *** deg  POSTURE: {posture:25561}  PALPATION: ***  LUMBAR ROM:   AROM eval  Flexion   Extension   Right lateral flexion   Left lateral flexion   Right rotation   Left rotation    (Blank rows = not tested)  LOWER EXTREMITY ROM:     {AROM/PROM:27142}  Right eval Left eval  Hip flexion    Hip extension    Hip abduction     Hip adduction    Hip internal rotation    Hip external rotation    Knee flexion    Knee extension    Ankle dorsiflexion    Ankle plantarflexion    Ankle inversion    Ankle eversion     (Blank rows = not tested)  LOWER EXTREMITY MMT:    MMT Right eval Left eval  Hip flexion    Hip extension    Hip abduction    Hip adduction    Hip internal rotation    Hip external rotation    Knee flexion    Knee extension    Ankle dorsiflexion    Ankle plantarflexion    Ankle inversion    Ankle eversion     (Blank rows = not tested)  LUMBAR SPECIAL TESTS:  {lumbar special test:25242}  FUNCTIONAL TESTS:  {Functional tests:24029}  GAIT: Distance walked: *** Assistive device utilized: {Assistive devices:23999} Level of assistance: {Levels of assistance:24026} Comments: ***  TREATMENT DATE: 07/01/2024 physical therapy evaluation and HEP instruction                                                                                                                                 PATIENT EDUCATION:  Education details: Patient educated on exam findings, POC, scope of PT, HEP, and ***. Person educated: Patient Education method: Explanation, Demonstration, and Handouts Education comprehension: verbalized understanding, returned demonstration, verbal cues required, and tactile cues required  HOME EXERCISE PROGRAM: ***  ASSESSMENT:  CLINICAL IMPRESSION: Patient is a 39 y.o. male who was seen today for physical therapy evaluation and treatment for S34.109S (ICD-10-CM) - Unspecified injury to unspecified level of lumbar spinal cord, sequela.   OBJECTIVE IMPAIRMENTS: {opptimpairments:25111}.   ACTIVITY LIMITATIONS: {activitylimitations:27494}  PARTICIPATION LIMITATIONS: {participationrestrictions:25113}  PERSONAL FACTORS: {Personal factors:25162} are also affecting patient's functional outcome.   REHAB POTENTIAL: Good  CLINICAL DECISION MAKING: Evolving/moderate  complexity  EVALUATION COMPLEXITY: Moderate   GOALS: Goals reviewed with patient? No  SHORT TERM GOALS:  Target date: ***  patient will be independent with initial HEP and compliant with HEP 3-4 times a week   Baseline: Goal status: INITIAL  2.  Patient will report 50% improvement overall  Baseline:  Goal status: INITIAL  3.  *** Baseline:  Goal status: INITIAL  4.  *** Baseline:  Goal status: INITIAL  5.  *** Baseline:  Goal status: INITIAL  6.  *** Baseline:  Goal status: INITIAL  LONG TERM GOALS: Target date: ***  Patient will be independent in self management strategies to improve quality of life and functional outcomes.  Baseline:  Goal status: INITIAL  2.  Patient will report 70% improvement overall  Baseline:  Goal status: INITIAL  3.  *** Baseline:  Goal status: INITIAL  4.  *** Baseline:  Goal status: INITIAL  5.  *** Baseline:  Goal status: INITIAL  6.  *** Baseline:  Goal status: INITIAL  PLAN:  PT FREQUENCY: {rehab frequency:25116}  PT DURATION: {rehab duration:25117}  PLANNED INTERVENTIONS: 97164- PT Re-evaluation, 97110-Therapeutic exercises, 97530- Therapeutic activity, 97112- Neuromuscular re-education, 97535- Self Care, 02859- Manual therapy, [id number]- Gait training, [id number]- Orthotic Fit/training, [id number]- Canalith repositioning, [id number]- Aquatic Therapy, [id number]- Splinting, [id number]- Wound care (first 20 sq cm), 97598- Wound care (each additional 20 sq cm)Patient/Family education, Balance training, Stair training, Taping, Dry Needling, Joint mobilization, Joint manipulation, Spinal manipulation, Spinal mobilization, Scar mobilization, and DME instructions. SABRA  PLAN FOR NEXT SESSION: Review HEP and goals;    7:40 AM, 07/01/24 Nalda Shackleford Small Patricia Fargo MPT [place] [facility]  [phone] [phone]  "

## 2024-07-01 ENCOUNTER — Ambulatory Visit (HOSPITAL_COMMUNITY)

## 2024-08-01 ENCOUNTER — Ambulatory Visit (HOSPITAL_COMMUNITY)
